# Patient Record
Sex: Male | Born: 1990 | Hispanic: No | Marital: Single | Smoking: Current every day smoker
Health system: Southern US, Community
[De-identification: ages and names within clinical notes are randomized; demographics above are authoritative.]

---

## 2014-04-21 ENCOUNTER — Emergency Department (HOSPITAL_COMMUNITY)
Admission: EM | Admit: 2014-04-21 | Discharge: 2014-04-21 | Disposition: A | Discharge status: Home / Self Care | Payer: Self-pay | Attending: Emergency Medicine | Admitting: Emergency Medicine

## 2014-04-21 DIAGNOSIS — I951 Orthostatic hypotension: Secondary | ICD-10-CM | POA: Insufficient documentation

## 2014-04-21 LAB — I-STAT CHEM 8, ED
BUN: 13 mg/dL (ref 6–23)
Calcium, Ion: 1.12 mmol/L (ref 1.12–1.23)
Chloride: 103 meq/L (ref 96–112)
Creatinine, Ser: 1.2 mg/dL (ref 0.50–1.35)
Glucose, Bld: 111 mg/dL — ABNORMAL HIGH (ref 70–99)
HCT: 43 % (ref 39.0–52.0)
Hemoglobin: 14.6 g/dL (ref 13.0–17.0)
Potassium: 4.1 meq/L (ref 3.7–5.3)
Sodium: 138 meq/L (ref 137–147)
TCO2: 24 mmol/L (ref 0–100)

## 2014-04-21 MED ORDER — SODIUM CHLORIDE 0.9 % IV SOLN
1000.0000 mL | INTRAVENOUS | Status: DC
  Administered 2014-04-21: 1000 mL via INTRAVENOUS

## 2014-04-21 MED ORDER — SODIUM CHLORIDE 0.9 % IV SOLN
1000.0000 mL | Freq: Once | INTRAVENOUS | Status: AC
  Administered 2014-04-21: 1000 mL via INTRAVENOUS
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
## 2014-04-21 NOTE — Discharge Instructions (Signed)
Syncope °Syncope means a person passes out (faints). The person usually wakes up in less than 5 minutes. It is important to seek medical care for syncope. °HOME CARE °· Have someone stay with you until you feel normal. °· Do not drive, use machines, or play sports until your doctor says it is okay. °· Keep all doctor visits as told. °· Lie down when you feel like you might pass out. Take deep breaths. Wait until you feel normal before standing up. °· Drink enough fluids to keep your pee (urine) clear or pale yellow. °· If you take blood pressure or heart medicine, get up slowly. Take several minutes to sit and then stand. °GET HELP RIGHT AWAY IF:  °· You have a severe headache. °· You have pain in the chest, belly (abdomen), or back. °· You are bleeding from the mouth or butt (rectum). °· You have black or tarry poop (stool). °· You have an irregular or very fast heartbeat. °· You have pain with breathing. °· You keep passing out, or you have shaking (seizures) when you pass out. °· You pass out when sitting or lying down. °· You feel confused. °· You have trouble walking. °· You have severe weakness. °· You have vision problems. °If you fainted, call your local emergency services (911 in [place] Do not drive yourself to the hospital. °MAKE SURE YOU:  °· Understand these instructions. °· Will watch your condition. °· Will get help right away if you are not doing well or get worse. °Document Released: 12/23/2007 Document Revised: 01/05/2012 Document Reviewed: 09/04/2011 °Exit[employer]® Patient Information ©2015 [employer]. This information is not intended to replace advice given to you by your health care provider. Make sure you discuss any questions you have with your health care provider. ° °

## 2014-04-21 NOTE — ED Notes (Signed)
[facility] presents with a 23 yo male from home with a syncopal episode.  Pt gave plasma today and plasma center was not able to give full blood return.  Pt did not eat or drink very well yesterday or today.  Pt was cooking when he didn't feel well, went to bathroom and passed out.  [facility] arrival pt was diaphoretic but alert and oriented; pt passed out again with [facility].  700 ml of NS given.  NSR on monitor.  CBG 96 mg/dl.  BP 115/65.  Alert and oriented at this time.

## 2017-03-12 ENCOUNTER — Emergency Department (HOSPITAL_COMMUNITY)
Admission: EM | Admit: 2017-03-12 | Discharge: 2017-03-13 | Disposition: A | Discharge status: Home / Self Care | Payer: Self-pay | Attending: Emergency Medicine | Admitting: Emergency Medicine

## 2017-03-12 ENCOUNTER — Encounter (HOSPITAL_COMMUNITY): Payer: Self-pay | Admitting: Emergency Medicine

## 2017-03-12 ENCOUNTER — Emergency Department (HOSPITAL_COMMUNITY): Payer: Self-pay

## 2017-03-12 DIAGNOSIS — Y998 Other external cause status: Secondary | ICD-10-CM | POA: Insufficient documentation

## 2017-03-12 DIAGNOSIS — F17200 Nicotine dependence, unspecified, uncomplicated: Secondary | ICD-10-CM | POA: Insufficient documentation

## 2017-03-12 DIAGNOSIS — R058 Other specified cough: Secondary | ICD-10-CM

## 2017-03-12 DIAGNOSIS — S0083XA Contusion of other part of head, initial encounter: Secondary | ICD-10-CM | POA: Insufficient documentation

## 2017-03-12 DIAGNOSIS — Y929 Unspecified place or not applicable: Secondary | ICD-10-CM | POA: Insufficient documentation

## 2017-03-12 DIAGNOSIS — R112 Nausea with vomiting, unspecified: Secondary | ICD-10-CM | POA: Insufficient documentation

## 2017-03-12 DIAGNOSIS — Y9389 Activity, other specified: Secondary | ICD-10-CM | POA: Insufficient documentation

## 2017-03-12 DIAGNOSIS — R05 Cough: Secondary | ICD-10-CM | POA: Insufficient documentation

## 2017-03-12 MED ORDER — ONDANSETRON HCL 4 MG/2ML IJ SOLN
4.0000 mg | Freq: Once | INTRAMUSCULAR | Status: AC
  Administered 2017-03-12: 4 mg via INTRAVENOUS
  Filled 2017-03-12: qty 2

## 2017-03-12 MED ORDER — MORPHINE SULFATE (PF) 4 MG/ML IV SOLN
4.0000 mg | Freq: Once | INTRAVENOUS | Status: AC
  Administered 2017-03-12: 4 mg via INTRAVENOUS
  Filled 2017-03-12: qty 1

## 2017-03-12 MED ORDER — ACETAMINOPHEN 325 MG PO TABS
325.0000 mg | ORAL_TABLET | Freq: Once | ORAL | Status: AC
  Administered 2017-03-12: 325 mg via ORAL
  Filled 2017-03-12: qty 1

## 2017-03-12 MED ORDER — IPRATROPIUM-ALBUTEROL 0.5-2.5 (3) MG/3ML IN SOLN
3.0000 mL | Freq: Once | RESPIRATORY_TRACT | Status: AC
  Administered 2017-03-13: 3 mL via RESPIRATORY_TRACT
  Filled 2017-03-12: qty 3
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
## 2017-03-13 MED ORDER — ALBUTEROL SULFATE HFA 108 (90 BASE) MCG/ACT IN AERS: 1.0000 | INHALATION_SPRAY | Freq: Four times a day (QID) | RESPIRATORY_TRACT | 0 refills | Status: AC | PRN
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
## 2017-03-30 ENCOUNTER — Telehealth: Payer: Self-pay
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
## 2017-04-01 ENCOUNTER — Ambulatory Visit: Payer: Self-pay | Admitting: Family Medicine

## 2018-11-30 IMAGING — DX DG CHEST 2V
2 series · 2 of 2 positions shown · non-contrast
Comparison: None.

CLINICAL DATA: Diffuse rhonchi

EXAM:
CHEST  2 VIEW

[chest pa]
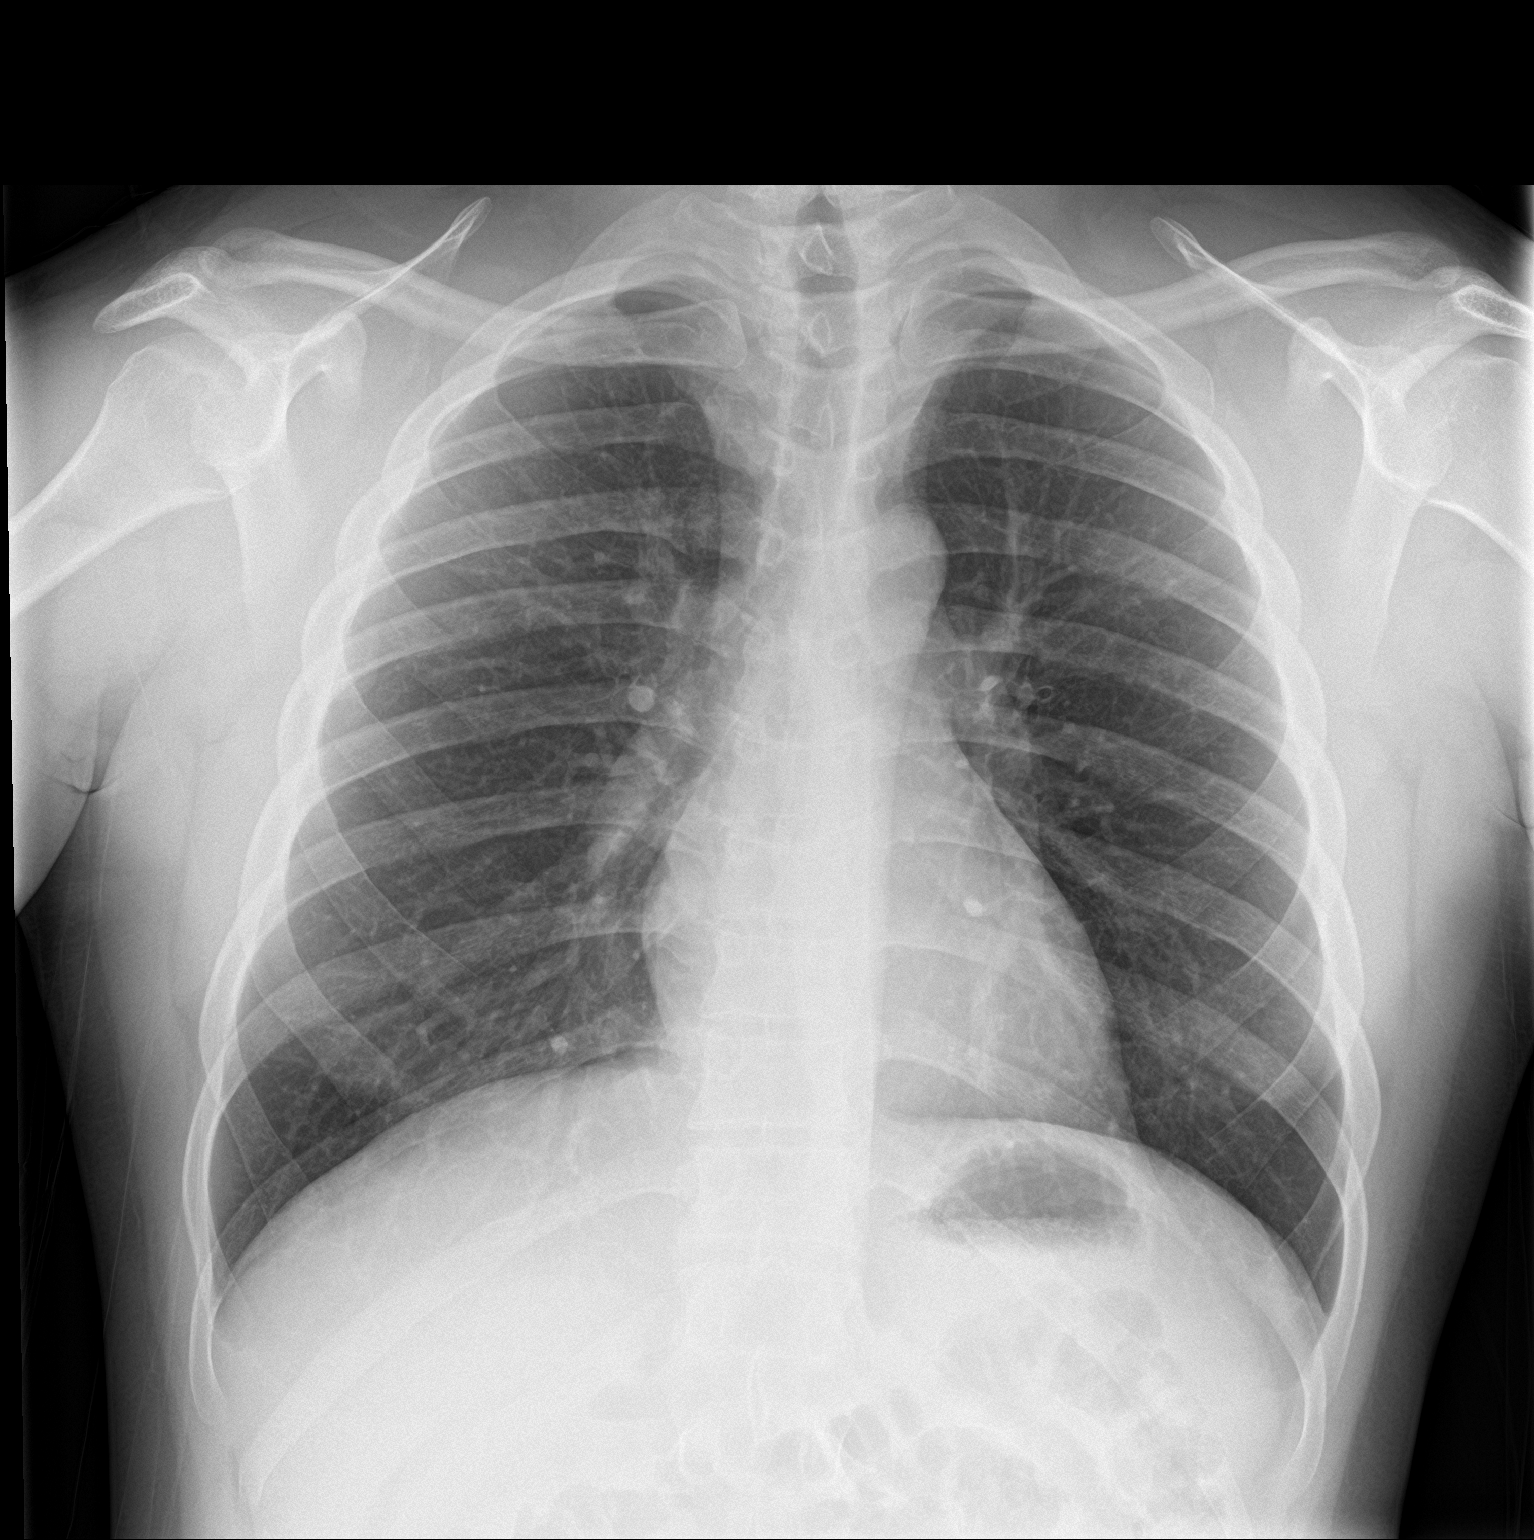

[chest lat]
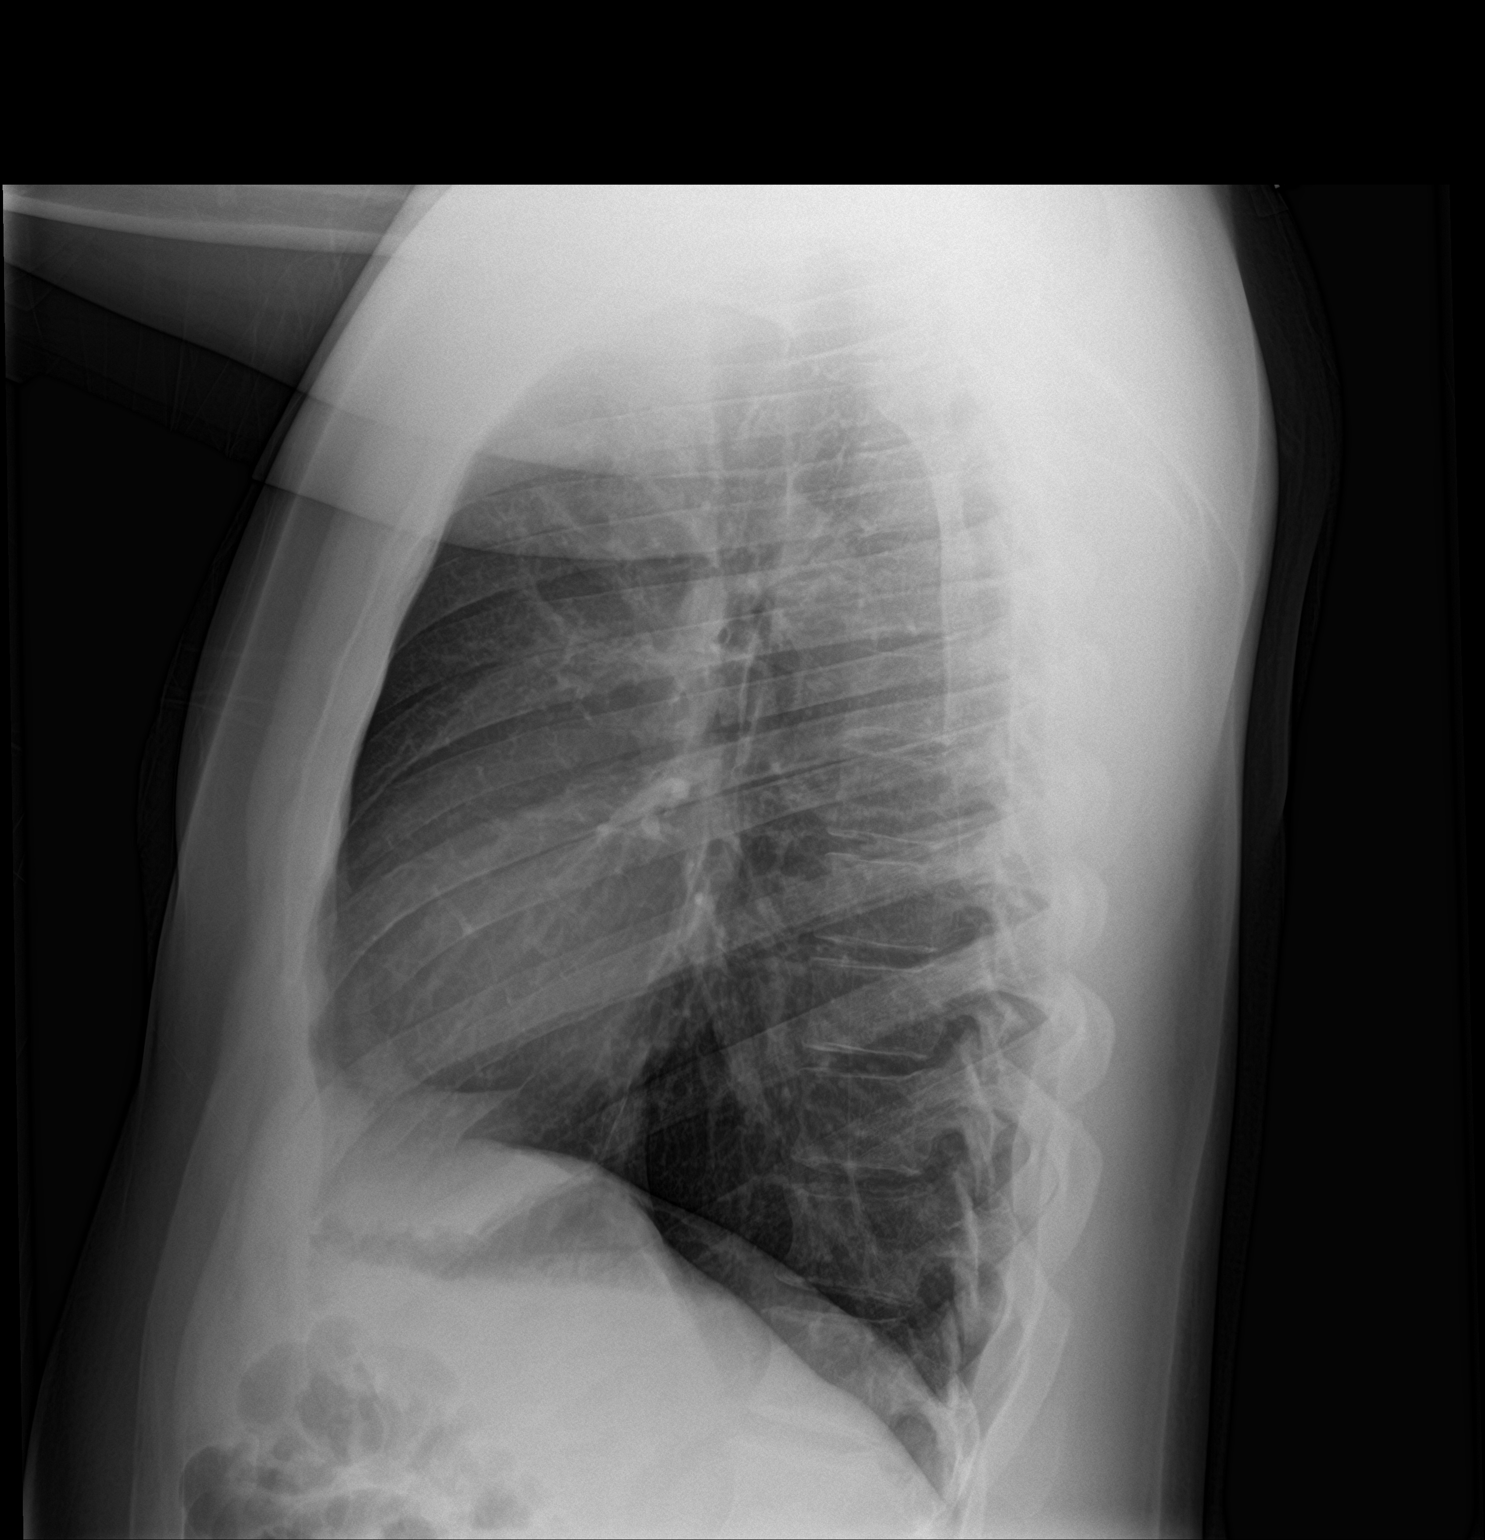

[2 of 2 positions shown; findings below may reference images not displayed]

FINDINGS: Lungs are clear.  No pleural effusion or pneumothorax.

The heart is normal in size.

Visualized osseous structures are within normal limits.
IMPRESSION: No active cardiopulmonary disease.

## 2019-05-22 ENCOUNTER — Other Ambulatory Visit: Payer: Self-pay

## 2019-05-22 DIAGNOSIS — Z20822 Contact with and (suspected) exposure to covid-19: Secondary | ICD-10-CM

## 2019-05-24 LAB — NOVEL CORONAVIRUS, NAA: SARS-CoV-2, NAA: NOT DETECTED

## 2019-10-27 ENCOUNTER — Ambulatory Visit: Payer: Self-pay | Attending: Internal Medicine

## 2019-11-02 ENCOUNTER — Ambulatory Visit: Payer: Self-pay | Attending: Internal Medicine

## 2019-11-02 DIAGNOSIS — Z23 Encounter for immunization: Secondary | ICD-10-CM

## 2019-11-02 NOTE — Progress Notes (Signed)
   Covid-19 Vaccination Clinic  Name:  Arthur Hansen    MRN: [id number] DOB: 11-02-1990  11/02/2019  Mr. Arthur Hansen was observed post Covid-19 immunization for 15 minutes without incident. He was provided with Vaccine Information Sheet and instruction to access the V-Safe system.   Mr. Arthur Hansen was instructed to call 911 with any severe reactions post vaccine: Marland Kitchen Difficulty breathing  . Swelling of face and throat  . A fast heartbeat  . A bad rash all over body  . Dizziness and weakness   Immunizations Administered    Name Date Dose VIS Date Route   Pfizer COVID-19 Vaccine 11/02/2019 10:34 AM 0.3 mL 06/30/2019 Intramuscular   Manufacturer: [employer], [employer]   Lot: [id number]   NDC: [address]-0

## 2019-11-29 ENCOUNTER — Ambulatory Visit: Payer: Self-pay | Attending: Internal Medicine

## 2019-11-29 DIAGNOSIS — Z23 Encounter for immunization: Secondary | ICD-10-CM

## 2019-11-29 NOTE — Progress Notes (Signed)
   Covid-19 Vaccination Clinic  Name:  Arthur Hansen    MRN: [id number] DOB: 06/03/1991  11/29/2019  Mr. [name] was observed post Covid-19 immunization for 15 minutes without incident. He was provided with Vaccine Information Sheet and instruction to access the V-Safe system.   Mr. [name] was instructed to call 911 with any severe reactions post vaccine: Marland Kitchen Difficulty breathing  . Swelling of face and throat  . A fast heartbeat  . A bad rash all over body  . Dizziness and weakness   Immunizations Administered    Name Date Dose VIS Date Route   Pfizer COVID-19 Vaccine 11/29/2019  1:34 PM 0.3 mL 09/13/2018 Intramuscular   Manufacturer: [employer], [employer]   Lot: [id number]   NDC: [address]-4

## 2023-07-28 ENCOUNTER — Emergency Department (HOSPITAL_COMMUNITY): Payer: 59

## 2023-07-28 ENCOUNTER — Encounter (HOSPITAL_COMMUNITY): Payer: Self-pay | Admitting: Emergency Medicine

## 2023-07-28 ENCOUNTER — Emergency Department (HOSPITAL_COMMUNITY)
Admission: EM | Admit: 2023-07-28 | Discharge: 2023-07-29 | Disposition: A | Discharge status: Home / Self Care | Payer: 59 | Attending: Emergency Medicine | Admitting: Emergency Medicine

## 2023-07-28 DIAGNOSIS — G40909 Epilepsy, unspecified, not intractable, without status epilepticus: Secondary | ICD-10-CM | POA: Insufficient documentation

## 2023-07-28 DIAGNOSIS — R569 Unspecified convulsions: Secondary | ICD-10-CM | POA: Diagnosis present

## 2023-07-28 DIAGNOSIS — G40109 Localization-related (focal) (partial) symptomatic epilepsy and epileptic syndromes with simple partial seizures, not intractable, without status epilepticus: Secondary | ICD-10-CM | POA: Diagnosis not present

## 2023-07-28 LAB — CBC
HCT: 49.7 % (ref 39.0–52.0)
Hemoglobin: 15.5 g/dL (ref 13.0–17.0)
MCH: 27.4 pg (ref 26.0–34.0)
MCHC: 31.2 g/dL (ref 30.0–36.0)
MCV: 87.8 fL (ref 80.0–100.0)
Platelets: 378 10*3/uL (ref 150–400)
RBC: 5.66 MIL/uL (ref 4.22–5.81)
RDW: 13.6 % (ref 11.5–15.5)
WBC: 17.6 10*3/uL — ABNORMAL HIGH (ref 4.0–10.5)
nRBC: 0.4 % — ABNORMAL HIGH (ref 0.0–0.2)

## 2023-07-28 LAB — CBG MONITORING, ED: Glucose-Capillary: 92 mg/dL (ref 70–99)

## 2023-07-28 MED ORDER — LORAZEPAM 2 MG/ML IJ SOLN
1.0000 mg | Freq: Once | INTRAMUSCULAR | Status: AC
  Administered 2023-07-28: 1 mg via INTRAVENOUS
  Filled 2023-07-28: qty 1

## 2023-07-28 NOTE — ED Provider Notes (Addendum)
 [facility] Provider Note   CSN: [id number] Arrival date & time: 07/28/23  2240     History  Chief Complaint  Patient presents with   Seizures    Arthur Hansen is a 33 y.o. male.  Patient presented to the emergency department after he had a seizure at home.  No history of seizures.  While in triage she became unresponsive, then started shaking all over.  He bit his tongue.  Symptoms lasted about 30 seconds, was postictal after, brought to the ED exam room immediately for evaluation.       Home Medications Prior to Admission medications   Medication Sig Start Date End Date Taking? Authorizing Provider  lacosamide  (VIMPAT ) 50 MG TABS tablet Take 1 tablet (50 mg total) by mouth 2 (two) times daily. 07/29/23  Yes Charrise Lardner, Lonni JINNY, MD  albuterol  (PROVENTIL  HFA;VENTOLIN  HFA) 108 (90 Base) MCG/ACT inhaler Inhale 1-2 puffs into the lungs every 6 (six) hours as needed for wheezing or shortness of breath. 03/13/17   Norris Will JINNY, PA-C      Allergies    Patient has no known allergies.    Review of Systems   Review of Systems  Physical Exam Updated Vital Signs BP 118/71   Pulse 93   Temp 98.5 F (36.9 C)   Resp 18   SpO2 99%  Physical Exam Vitals and nursing note reviewed.  Constitutional:      General: He is not in acute distress.    Appearance: He is well-developed.  HENT:     Head: Normocephalic and atraumatic.     Mouth/Throat:     Mouth: Mucous membranes are moist.  Eyes:     General: Vision grossly intact. Gaze aligned appropriately.     Extraocular Movements: Extraocular movements intact.     Conjunctiva/sclera: Conjunctivae normal.  Cardiovascular:     Rate and Rhythm: Normal rate and regular rhythm.     Pulses: Normal pulses.     Heart sounds: Normal heart sounds, S1 normal and S2 normal. No murmur heard.    No friction rub. No gallop.  Pulmonary:     Effort: Pulmonary effort is normal. No respiratory  distress.     Breath sounds: Normal breath sounds.  Abdominal:     Palpations: Abdomen is soft.     Tenderness: There is no abdominal tenderness. There is no guarding or rebound.     Hernia: No hernia is present.  Musculoskeletal:        General: No swelling.     Cervical back: Full passive range of motion without pain, normal range of motion and neck supple. No pain with movement, spinous process tenderness or muscular tenderness. Normal range of motion.     Right lower leg: No edema.     Left lower leg: No edema.  Skin:    General: Skin is warm and dry.     Capillary Refill: Capillary refill takes less than 2 seconds.     Findings: No ecchymosis, erythema, lesion or wound.  Neurological:     Mental Status: He is alert. He is confused.     Cranial Nerves: Cranial nerves 2-12 are intact.     Sensory: Sensation is intact.     Motor: Motor function is intact. No weakness or abnormal muscle tone.     Coordination: Coordination is intact.  Psychiatric:        Mood and Affect: Mood normal.  Speech: Speech normal.        Behavior: Behavior normal.     ED Results / Procedures / Treatments   Labs (all labs ordered are listed, but only abnormal results are displayed) Labs Reviewed  BASIC METABOLIC PANEL - Abnormal; Notable for the following components:      Result Value   CO2 11 (*)    Glucose, Bld 114 (*)    Creatinine, Ser 1.34 (*)    Calcium 10.4 (*)    Anion gap 26 (*)    All other components within normal limits  CBC - Abnormal; Notable for the following components:   WBC 17.6 (*)    nRBC 0.4 (*)    All other components within normal limits  RAPID URINE DRUG SCREEN, HOSP PERFORMED - Abnormal; Notable for the following components:   Tetrahydrocannabinol POSITIVE (*)    All other components within normal limits  MAGNESIUM - Abnormal; Notable for the following components:   Magnesium 3.0 (*)    All other components within normal limits  ETHANOL  CK  CBG MONITORING,  ED    EKG None  Radiology CT HEAD WO CONTRAST ( ) Result Date: 07/29/2023 CLINICAL DATA:  New onset seizures.  No trauma history. EXAM: CT HEAD WITHOUT CONTRAST TECHNIQUE: Contiguous axial images were obtained from the base of the skull through the vertex without intravenous contrast. RADIATION DOSE REDUCTION: This exam was performed according to the departmental dose-optimization program which includes automated exposure control, adjustment of the mA and/or kV according to patient size and/or use of iterative reconstruction technique. COMPARISON:  CT scan head 03/12/2017 FINDINGS: Brain: No evidence of acute infarction, hemorrhage, hydrocephalus, extra-axial collection or mass lesion/mass effect. There is a partially empty sella, unchanged. Vascular: No hyperdense vessel or unexpected calcification. Skull: Normal. Negative for fracture or focal lesion. Sinuses/Orbits: Unremarkable orbits, orbital contents. Chronic nasal bone fracture deformity. There is mild membrane thickening in the ethmoid air cells, both maxillary sinuses. No fluid levels. Other paranasal sinuses are clear. There is chronic fluid in left mastoid tip. The mastoid air cells are otherwise clear. Nasal septum is slightly S shaped. Other: None. IMPRESSION: 1. No acute intracranial CT findings or interval changes. 2. Partially empty sella, unchanged. 3. Mild sinus membrane disease. 4. Chronic fluid in left mastoid tip. 5. Chronic nasal bone fracture deformity. Electronically Signed   By: Francis Quam M.D.   On: 07/29/2023 00:44   DG Chest Port 1 View Result Date: 07/28/2023 CLINICAL DATA:  Recent seizure activity EXAM: PORTABLE CHEST 1 VIEW COMPARISON:  03/12/2017 FINDINGS: The heart size and mediastinal contours are within normal limits. Both lungs are clear. The visualized skeletal structures are unremarkable. IMPRESSION: No active disease. Electronically Signed   By: Oneil Devonshire M.D.   On: 07/28/2023 23:28     Procedures Procedures    Medications Ordered in ED Medications  lacosamide  (VIMPAT ) tablet 50 mg (has no administration in time range)  LORazepam  (ATIVAN ) injection 1 mg (1 mg Intravenous Given 07/28/23 2346)  sodium chloride  0.9 % bolus 1,000 mL (0 mLs Intravenous Stopped 07/29/23 0506)  ondansetron  (ZOFRAN ) injection 4 mg (4 mg Intravenous Given 07/29/23 0454)    ED Course/ Medical Decision Making/ A&P                                 Medical Decision Making Amount and/or Complexity of Data Reviewed Labs: ordered. Radiology: ordered.  Risk Prescription drug management.  Differential diagnosis considered includes, but not limited to: TIA; Stroke; ICH; Seizure; electrolyte abnormality; hypoglycemia; toxic/pharmacologic causes; CNS infection; psychiatric disorder   Brought to the emergency department after suspected seizure.  Patient does not have a seizure disorder.  He does admit to smoking marijuana tonight.  Patient was noted to have an unresponsive episode with shaking by friends that was suspected to be a seizure.  He was brought to the ED and in triage had a witnessed seizure.  Both are self-limited.  Patient postictal when I evaluated him.  Was given Ativan  to prevent further seizures.  CT head unremarkable.  Lab work unremarkable.  Patient has now returned to baseline.  Patient reports that he did recognize a strange smell before the seizures.  He reports that he has smelled the smell several times recently but has not had a seizure previously.  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  Discussed briefly with neurology on-call, will perform MRI brain with and without contrast, EEG.  Initiate Vimpat  per neurology.  Patient with anion gap at arrival, felt to be consistent with increased lactic acid secondary to 2 generalized tonic-clonic seizures.  Does not require recheck, given IV hydration.        Final Clinical Impression(s) / ED Diagnoses Final diagnoses:   Seizure disorder (HCC)    Rx / DC Orders ED Discharge Orders          Ordered    Ambulatory referral to Neurology  Status:  Canceled       Comments: An appointment is requested in approximately: 4 weeks   07/29/23 0508    levETIRAcetam  (KEPPRA ) 500 MG tablet  2 times daily,   Status:  Discontinued        07/29/23 0508    Ambulatory referral to Neurology       Comments: An appointment is requested in approximately: 4 weeks - 8 weeks   07/29/23 0524    lacosamide  (VIMPAT ) 50 MG TABS tablet  2 times daily        07/29/23 0658              Haze Lonni PARAS, MD 07/29/23 9490    Haze Lonni PARAS, MD 07/29/23 [phone]

## 2023-07-28 NOTE — ED Notes (Signed)
 Pt had an approximate 30 second seizure while in triage. Pt moved to treatment room.

## 2023-07-28 NOTE — ED Triage Notes (Signed)
 BIB EMS for seizure. Pt was in car with friend and had a 30 second seizure. Pt was able to get pt out of car and onto the ground. Pt does not have a hx of seizures. Pt was postictal on EMS arrival. Abrasions to left elbow. No oral trauma. Pt was confused on EMS arrival and came around about 10 mins later.    CBG 111

## 2023-07-28 NOTE — ED Triage Notes (Signed)
 Pt ambulatory to bathroom. Alert and oriented. Admits to smoking marijuana this evening states it is a source he get is from all the time.

## 2023-07-29 ENCOUNTER — Emergency Department (HOSPITAL_COMMUNITY): Payer: 59

## 2023-07-29 DIAGNOSIS — G40109 Localization-related (focal) (partial) symptomatic epilepsy and epileptic syndromes with simple partial seizures, not intractable, without status epilepticus: Secondary | ICD-10-CM

## 2023-07-29 DIAGNOSIS — G40909 Epilepsy, unspecified, not intractable, without status epilepticus: Secondary | ICD-10-CM

## 2023-07-29 LAB — BASIC METABOLIC PANEL
Anion gap: 26 — ABNORMAL HIGH (ref 5–15)
BUN: 10 mg/dL (ref 6–20)
CO2: 11 mmol/L — ABNORMAL LOW (ref 22–32)
Calcium: 10.4 mg/dL — ABNORMAL HIGH (ref 8.9–10.3)
Chloride: 107 mmol/L (ref 98–111)
Creatinine, Ser: 1.34 mg/dL — ABNORMAL HIGH (ref 0.61–1.24)
GFR, Estimated: >60 mL/min (ref >60)
Glucose, Bld: 114 mg/dL — ABNORMAL HIGH (ref 70–99)
Potassium: 3.8 mmol/L (ref 3.5–5.1)
Sodium: 144 mmol/L (ref 135–145)

## 2023-07-29 LAB — RAPID URINE DRUG SCREEN, HOSP PERFORMED
Amphetamines: NOT DETECTED
Barbiturates: NOT DETECTED
Benzodiazepines: NOT DETECTED
Cocaine: NOT DETECTED
Opiates: NOT DETECTED
Tetrahydrocannabinol: POSITIVE — AB

## 2023-07-29 LAB — CK: Total CK: 384 U/L (ref 49–397)

## 2023-07-29 LAB — ETHANOL: Alcohol, Ethyl (B): <10 mg/dL (ref <10)

## 2023-07-29 LAB — MAGNESIUM: Magnesium: 3 mg/dL — ABNORMAL HIGH (ref 1.7–2.4)

## 2023-07-29 MED ORDER — LEVETIRACETAM IN NACL 1500 MG/100ML IV SOLN
1500.0000 mg | Freq: Once | INTRAVENOUS | Status: DC
  Filled 2023-07-29: qty 100

## 2023-07-29 MED ORDER — SODIUM CHLORIDE 0.9 % IV BOLUS
1000.0000 mL | Freq: Once | INTRAVENOUS | Status: AC
  Administered 2023-07-29: 1000 mL via INTRAVENOUS

## 2023-07-29 MED ORDER — ACETAMINOPHEN 325 MG PO TABS
325.0000 mg | ORAL_TABLET | Freq: Once | ORAL | Status: AC
  Administered 2023-07-29: 325 mg via ORAL
  Filled 2023-07-29: qty 1

## 2023-07-29 MED ORDER — LACOSAMIDE 50 MG PO TABS: 50.0000 mg | ORAL_TABLET | Freq: Two times a day (BID) | ORAL | 3 refills | Status: DC

## 2023-07-29 MED ORDER — IBUPROFEN 800 MG PO TABS
800.0000 mg | ORAL_TABLET | Freq: Once | ORAL | Status: AC
  Administered 2023-07-29: 800 mg via ORAL
  Filled 2023-07-29: qty 1

## 2023-07-29 MED ORDER — LEVETIRACETAM 500 MG PO TABS: 500.0000 mg | ORAL_TABLET | Freq: Once | ORAL | Status: DC

## 2023-07-29 MED ORDER — ONDANSETRON HCL 4 MG/2ML IJ SOLN
4.0000 mg | Freq: Once | INTRAMUSCULAR | Status: AC
  Administered 2023-07-29: 4 mg via INTRAVENOUS
  Filled 2023-07-29: qty 2

## 2023-07-29 MED ORDER — LEVETIRACETAM 500 MG PO TABS: 500.0000 mg | ORAL_TABLET | Freq: Two times a day (BID) | ORAL | 2 refills | Status: DC

## 2023-07-29 MED ORDER — LORAZEPAM 2 MG/ML IJ SOLN
1.0000 mg | Freq: Once | INTRAMUSCULAR | Status: AC
  Administered 2023-07-29: 1 mg via INTRAVENOUS
  Filled 2023-07-29: qty 1

## 2023-07-29 MED ORDER — LACOSAMIDE 50 MG PO TABS
50.0000 mg | ORAL_TABLET | Freq: Two times a day (BID) | ORAL | Status: DC
  Administered 2023-07-29: 50 mg via ORAL
  Filled 2023-07-29: qty 1

## 2023-07-29 NOTE — ED Provider Notes (Addendum)
 7:15 AM Patient signed out to me by previous ED physician. Pt is a 33 yo male presenting for new seizure x 2 episodes. Post ictal on arrival.   Neuro has seen. Pt started on Vimpat . CT head stable. Beside EEG completed overnight and normal.  MRI pending  Plan for outpatient f/u with neuro outpatient   Physical Exam  BP 118/71   Pulse 93   Temp 98.5 F (36.9 C)   Resp 18   SpO2 99%   Physical Exam  Procedures  Procedures  ED Course / MDM    Medical Decision Making Amount and/or Complexity of Data Reviewed Labs: ordered. Radiology: ordered.  Risk Prescription drug management.   MRI was limited due to patient movement but overall was very stable.  Patient ready for outpatient follow-up with neurology and safe for discharge at this time.  Patient in no distress and overall condition improved here in the ED. Detailed discussions were had with the patient regarding current findings, and need for close f/u with PCP or on call doctor. The patient has been instructed to return immediately if the symptoms worsen in any way for re-evaluation. Patient verbalized understanding and is in agreement with current care plan. All questions answered prior to discharge.        Elnor Bernarda SQUIBB, DO 07/29/23 1034    Elnor Bernarda SQUIBB, DO 07/29/23 1102

## 2023-07-29 NOTE — ED Notes (Signed)
 Patient transported to MRI

## 2023-07-29 NOTE — Procedures (Signed)
 Patient Name: Arthur Hansen  MRN: [id number]  Epilepsy Attending: Arlin MALVA Krebs  Referring Physician/Provider: Jerrie Lola CROME, MD  Date: 07/29/2023 Duration: 24.15 mins  Patient history: 33 y.o. with no significant past medical history, presenting with first-time seizure.   Level of alertness: Awake, drowsy  AEDs during EEG study:   Technical aspects: This EEG study was done with scalp electrodes positioned according to the 10-20 International system of electrode placement. Electrical activity was reviewed with band pass filter of 1-70Hz , sensitivity of 7 uV/mm, display speed of 83mm/sec with a 60Hz  notched filter applied as appropriate. EEG data were recorded continuously and digitally stored.  Video monitoring was available and reviewed as appropriate.  Description: The posterior dominant rhythm consists of 10 Hz activity of moderate voltage (25-35 uV) seen predominantly in posterior head regions, symmetric and reactive to eye opening and eye closing. Drowsiness was characterized by attenuation of the posterior background rhythm. Hyperventilation and photic stimulation were not performed.     IMPRESSION: This study is within normal limits. No seizures or epileptiform discharges were seen throughout the recording.  A normal interictal EEG does not exclude the diagnosis of epilepsy.  [name]

## 2023-07-29 NOTE — Consult Note (Signed)
 NEUROLOGY CONSULT NOTE   Date of service: July 29, 2023 Patient Name: Arthur Hansen MRN:  [id number] DOB:  07/02/1991 Chief Complaint: Seizure Requesting Provider: Haze Lonni JINNY, *  History of Present Illness  Arthur Hansen is a 33 y.o. with no significant past medical history, presenting with first-time seizure.  He is accompanied by his breast friend and his uncle who helps supplement the history as he is still somewhat fatigued  They report he is in his usual state of health today when he got a look of panic and smelled something burning.  This was followed by generalized tonic-clonic seizure.  He did fully return to baseline after this event but had a second generalized tonic-clonic seizure witnessed while in the waiting room which was again preceded by the same aura.  He does report that he suffers from depression to some degree. Some reduced sleep recently, otherwise no concerns   Spell #  - Semiology: Generalized tonic clonic seizure - Prodome: Burning smell, panicked look - Post-spell: Confusion - Triggers: Sleep deprivation,?  THC exposure - Frequency: Aura alone 1x every 2 months since approx Jun 2024, 1/8 first time this progressed to GTC (x 2 episodes)   Risk factors:  Birth and development: May have been premature Febrile seizures in childhood: No Significant head trauma: 1 concussion without loss of consciousness Intracranial surgeries: No Mengingitis/Encephalitis history: No Family history of seizures or developmental delay: No Family history of autoimmune disease: No   ROS  Comprehensive ROS performed and pertinent positives documented in HPI    Past History  History reviewed. No pertinent past medical history.  History reviewed. No pertinent surgical history.  Family History: No history of seizures or autoimmune disease or developmental delay  Social History  reports that he has been smoking. He has never used smokeless tobacco. He  reports current drug use. Drug: Marijuana. He reports that he does not drink alcohol.  No Known Allergies  Medications   Current Facility-Administered Medications:    levETIRAcetam  (KEPPRA ) tablet 500 mg, 500 mg, Oral, Once, Pollina, Lonni JINNY, MD  Current Outpatient Medications:    levETIRAcetam  (KEPPRA ) 500 MG tablet, Take 1 tablet (500 mg total) by mouth 2 (two) times daily., Disp: 60 tablet, Rfl: 2   albuterol  (PROVENTIL  HFA;VENTOLIN  HFA) 108 (90 Base) MCG/ACT inhaler, Inhale 1-2 puffs into the lungs every 6 (six) hours as needed for wheezing or shortness of breath., Disp: 1 Inhaler, Rfl: 0  Vitals   Vitals:   07/28/23 2249 07/29/23 0308 07/29/23 0310  BP: 120/75  131/84  Pulse: 80  88  Resp: 18  (!) 22  Temp: 98.5 F (36.9 C) 98.6 F (37 C)   TempSrc: Oral Oral   SpO2: 98%  96%    There is no height or weight on file to calculate BMI.  Physical Exam   Constitutional: Appears well-developed and well-nourished.  Moving gingerly secondary to back pain Psych: Affect calm, cooperative, fatigued Eyes: No scleral injection.  HENT: No OP obstruction.  Head: Normocephalic.  Cardiovascular: Normal rate and regular rhythm.  Respiratory: Effort normal, non-labored breathing.  GI: Soft.  No distension. There is no tenderness.  Skin: WDI.  MSK: No point tenderness to spine palpation. No step offs  Neurologic Examination   Neuro: Mental Status: Patient is sleepy but able to stay awake for exam, oriented to person, place, month, year, and situation. Patient is able to give a clear and coherent history. No signs of aphasia or neglect Cranial Nerves: II:  Visual Fields are full. Pupils are equal, round, and reactive to light.   III,IV, VI: EOMI without ptosis or diploplia.  V: Facial sensation is symmetric to temperature VII: Facial movement is symmetric.  VIII: hearing is intact to voice X: Uvula elevates symmetrically XI: Shoulder shrug is symmetric. XII: tongue is  midline without atrophy or fasciculations.  Motor: Tone is normal. Bulk is normal. 4+/5 strength was present in all four extremities, symmetric (secondary to fatigue, muscle aches, recent seizure)  Sensory: Sensation is symmetric to light touch and temperature in the arms and legs. Deep Tendon Reflexes: 2+ and symmetric in the biceps and patellae.  Cerebellar: FNF and HKS are intact bilaterally Gait:  Antalgic secondary to back pain but otherwise normal casual gait  Labs/Imaging/Neurodiagnostic studies   CBC:  Recent Labs  Lab Aug 18, 2023 2315  WBC 17.6*  HGB 15.5  HCT 49.7  MCV 87.8  PLT 378   Basic Metabolic Panel:  Lab Results  Component Value Date   NA 144 August 18, 2023   K 3.8 Aug 18, 2023   CO2 11 (L) 08/18/2023   GLUCOSE 114 (H) August 18, 2023   BUN 10 2023-08-18   CREATININE 1.34 (H) August 18, 2023   CALCIUM 10.4 (H) 08/18/23   GFRNONAA >60 08-18-23   Lipid Panel: No results found for: LDLCALC HgbA1c: No results found for: HGBA1C Urine Drug Screen:     Component Value Date/Time   LABOPIA NONE DETECTED 08-18-23 2318   COCAINSCRNUR NONE DETECTED 08/18/2023 2318   LABBENZ NONE DETECTED 2023/08/18 2318   AMPHETMU NONE DETECTED Aug 18, 2023 2318   THCU POSITIVE (A) 08/18/23 2318   LABBARB NONE DETECTED 08-18-2023 2318    Alcohol Level     Component Value Date/Time   ETH <10 Aug 18, 2023 2315   INR No results found for: INR APTT No results found for: APTT AED levels: No results found for: PHENYTOIN, ZONISAMIDE, LAMOTRIGINE, LEVETIRACETA  CT Head without contrast(Personally reviewed): 1. No acute intracranial CT findings or interval changes. 2. Partially empty sella, unchanged. 3. Mild sinus membrane disease. 4. Chronic fluid in left mastoid tip. 5. Chronic nasal bone fracture deformity.  MRI Brain with and without contrast (Personally reviewed): pending  Neurodiagnostics rEEG:  Report pending  ASSESSMENT   Arthur Hansen is a 33 y.o.  male who has a history significant for depression presenting with new onset seizures with aura.  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  MRI brain with and without contrast is prudent to more sensitively rule out structural lesions than Head CT.   RECOMMENDATIONS  - Vimpat  50 mg BID   - Outpatient consider cross titration to lamotrigine for dual benefit of mood stabilization and seizure control - Routine EEG, completed, read pending - MRI brain w/ and w/o contrast - If MRI brain is reassuring, outpatient neurology follow-up is appropriate - Outpatient referral to Dr. Gregg  Standard seizure precautions: Per [place]  DMV statutes, patients with seizures are not allowed to drive until  they have been seizure-free for six months. Use caution when using heavy equipment or power tools. Avoid working on ladders or at heights. Take showers instead of baths. Ensure the water temperature is not too high on the home water heater. Do not go swimming alone. When caring for infants or small children, sit down when holding, feeding, or changing them to minimize risk of injury to the child in the event you have a seizure.  To reduce risk of seizures, maintain good sleep hygiene avoid  alcohol and illicit drug use, take all anti-seizure medications as prescribed.   ______________________________________________________________________   Lola Jernigan MD-PhD [facility] [phone] Available 7 PM to 7 AM, outside of these hours please call Neurologist on call as listed on Amion.

## 2023-07-29 NOTE — Discharge Instructions (Addendum)
Seizure precautions: Per [place] DMV statutes, patients with seizures are not allowed to drive until they have been seizure-free for six months and cleared by a physician    Use caution when using heavy equipment or power tools. Avoid working on ladders or at heights. Take showers instead of baths. Ensure the water temperature is not too high on the home water heater. Do not go swimming alone. Do not lock yourself in a room alone (i.e. bathroom). When caring for infants or small children, sit down when holding, feeding, or changing them to minimize risk of injury to the child in the event you have a seizure. Maintain good sleep hygiene. Avoid alcohol.  

## 2023-07-29 NOTE — Progress Notes (Signed)
 EEG complete - results pending

## 2023-08-04 ENCOUNTER — Encounter: Payer: Self-pay | Admitting: Neurology

## 2023-08-04 ENCOUNTER — Ambulatory Visit (INDEPENDENT_AMBULATORY_CARE_PROVIDER_SITE_OTHER): Payer: 59 | Admitting: Neurology

## 2023-08-04 VITALS — BP 131/88 | HR 76 | Ht 69.0 in | Wt 217.9 lb

## 2023-08-04 DIAGNOSIS — R569 Unspecified convulsions: Secondary | ICD-10-CM

## 2023-08-04 MED ORDER — LACOSAMIDE 100 MG PO TABS: 100.0000 mg | ORAL_TABLET | Freq: Two times a day (BID) | ORAL | 5 refills | Status: AC

## 2023-08-04 NOTE — Progress Notes (Signed)
 [facility]  PATIENT: Arthur Hansen DOB: 1990-09-12  REQUESTING CLINICIAN: Ronnette Coke, MD HISTORY FROM: Patient  REASON FOR VISIT: Seizure    HISTORICAL  CHIEF COMPLAINT:  Chief Complaint  Patient presents with   New Patient (Initial Visit)    Rm13, granddad present, /internal hospital referral for new onset seizures:none to report since ed visit    HISTORY OF PRESENT ILLNESS:  This is a 32 year old gentleman with no reported past medical history who is presenting after his first lifetime seizure on January 8.  Patient reports having a first generalized seizure in the setting of smoking weed.  He reports experiencing abnormal smell for the past 8 months and had the same smell prior to his seizure. He was taken to the hospital where he was witnessed to have another generalized convulsion.  He is MRI did not show any acute abnormality and he was started on 2 antiseizure medications, levetiracetam  and lacosamide . EEG was normal.  Since discharge from the hospital he has been compliant with the medication but reports side effect of somnolence, fatigue, depression.  He denies any previous history of seizure but reports in the past 8 months he has been experiencing abnormal smell that he described as cold and mothballs smell.  He had this typical smell prior to this seizure.  He denies any other seizure risk factors, no family history of seizures.  Handedness: Right handed   Onset: July 28 2023  Seizure Type: Generalized convulsion. Abnormal smell prior to convulsion. He reports history of abnormal smells for the past 8 months   Current frequency: Only once but reports history of abnormal smell  Any injuries from seizures: None   Seizure risk factors: Concussion  Previous ASMs: None   Currenty ASMs: Levetiracetam  500 mg twice daily and Vimpat  50 mg twice daily   ASMs side effects: Tiredness, fatigue, dizziness, increase depression   Brain Images:  No acute abnormalities   Previous EEGs: Normal   OTHER MEDICAL CONDITIONS: None reported   REVIEW OF SYSTEMS: Full 14 system review of systems performed and negative with exception of: As noted in the HPI   ALLERGIES: No Known Allergies  HOME MEDICATIONS: Outpatient Medications Prior to Visit  Medication Sig Dispense Refill   albuterol  (PROVENTIL  HFA;VENTOLIN  HFA) 108 (90 Base) MCG/ACT inhaler Inhale 1-2 puffs into the lungs every 6 (six) hours as needed for wheezing or shortness of breath. 1 Inhaler 0   lacosamide  (VIMPAT ) 50 MG TABS tablet Take 1 tablet (50 mg total) by mouth 2 (two) times daily. 60 tablet 3   levETIRAcetam  (KEPPRA ) 500 MG tablet Take 500 mg by mouth 2 (two) times daily.     No facility-administered medications prior to visit.    PAST MEDICAL HISTORY: History reviewed. No pertinent past medical history.  PAST SURGICAL HISTORY: History reviewed. No pertinent surgical history.  FAMILY HISTORY: History reviewed. No pertinent family history.  SOCIAL HISTORY: Social History   Socioeconomic History   Marital status: Single    Spouse name: Not on file   Number of children: Not on file   Years of education: Not on file   Highest education level: Not on file  Occupational History   Not on file  Tobacco Use   Smoking status: Every Day   Smokeless tobacco: Never  Substance and Sexual Activity   Alcohol use: No   Drug use: Yes    Types: Marijuana    Comment: today   Sexual activity: Yes  Other Topics Concern  Not on file  Social History Narrative   Not on file   Social Drivers of Health   Financial Resource Strain: Not on file  Food Insecurity: Not on file  Transportation Needs: Not on file  Physical Activity: Not on file  Stress: Not on file  Social Connections: Not on file  Intimate Partner Violence: Not on file     PHYSICAL EXAM  GENERAL EXAM/CONSTITUTIONAL: Vitals:  Vitals:   08/04/23 1556 08/04/23 1630  BP: (!) 144/105 131/88   Pulse: 76   SpO2: 94%   Weight: 217 lb 14.4 oz (98.8 kg)   Height: 5\' 9"  (1.753 m)    Body mass index is 32.18 kg/m. Wt Readings from Last 3 Encounters:  08/04/23 217 lb 14.4 oz (98.8 kg)  03/12/17 170 lb (77.1 kg)   Patient is in no distress; well developed, nourished and groomed; neck is supple  MUSCULOSKELETAL: Gait, strength, tone, movements noted in Neurologic exam below  NEUROLOGIC: MENTAL STATUS:      No data to display         awake, alert, oriented to person, place and time recent and remote memory intact normal attention and concentration language fluent, comprehension intact, naming intact fund of knowledge appropriate  CRANIAL NERVE:  2nd, 3rd, 4th, 6th - Visual fields full to confrontation, extraocular muscles intact, no nystagmus 5th - facial sensation symmetric 7th - facial strength symmetric 8th - hearing intact 9th - palate elevates symmetrically, uvula midline 11th - shoulder shrug symmetric 12th - tongue protrusion midline  MOTOR:  normal bulk and tone, full strength in the BUE, BLE  SENSORY:  normal and symmetric to light touch  COORDINATION:  finger-nose-finger, fine finger movements normal  GAIT/STATION:  normal   DIAGNOSTIC DATA (LABS, IMAGING, TESTING) - I reviewed patient records, labs, notes, testing and imaging myself where available.  Lab Results  Component Value Date   WBC 17.6 (H) 07/28/2023   HGB 15.5 07/28/2023   HCT 49.7 07/28/2023   MCV 87.8 07/28/2023   PLT 378 07/28/2023      Component Value Date/Time   NA 144 07/28/2023 2315   K 3.8 07/28/2023 2315   CL 107 07/28/2023 2315   CO2 11 (L) 07/28/2023 2315   GLUCOSE 114 (H) 07/28/2023 2315   BUN 10 07/28/2023 2315   CREATININE 1.34 (H) 07/28/2023 2315   CALCIUM 10.4 (H) 07/28/2023 2315   GFRNONAA >60 07/28/2023 2315   No results found for: "CHOL", "HDL", "LDLCALC", "LDLDIRECT", "TRIG" No results found for: "HGBA1C" No results found for: "VITAMINB12" No  results found for: "TSH"  MRI Brain 07/29/2023 Incomplete study without evidence of acute intracranial abnormality.   Routine EEG 07/29/2023: Normal, no epileptiform discharges seen   I personally reviewed brain Images and previous EEG reports.   ASSESSMENT AND PLAN  33 y.o. year old male  with history of depression who is presenting after his first lifetime seizure. This was in the setting of smoking marijuana.  He also reports a sensation of abnormal smell that he has been experiencing for the past 48-months and had the same smell prior to his seizure.  He is on Keppra  and Vimpat  and complains of somnolence, tiredness, and worsening depression.  We will discontinue Keppra  and increase his Vimpat  to 100 mg twice daily.  Will repeat EEG, based on result we may repeat MRI brain.  I will contact him after completion of the EEG otherwise I will see him in 6 months for follow-up or sooner if worse.  Advised patient to contact me if he does have a breakthrough seizure.  We also discussed driving restriction for the next 6 months and he voiced understanding.   1. Seizures (HCC)     Patient Instructions  Discontinue Keppra   Increase Vimpat  to 100 mg twice daily  Routine EEG  Return in 6 months or sooner if worse  Please contact me if you have another seizure   Per [place]  DMV statutes, patients with seizures are not allowed to drive until they have been seizure-free for six months.  Other recommendations include using caution when using heavy equipment or power tools. Avoid working on ladders or at heights. Take showers instead of baths.  Do not swim alone.  Ensure the water temperature is not too high on the home water heater. Do not go swimming alone. Do not lock yourself in a room alone (i.e. bathroom). When caring for infants or small children, sit down when holding, feeding, or changing them to minimize risk of injury to the child in the event you have a seizure. Maintain good sleep hygiene.  Avoid alcohol.  Also recommend adequate sleep, hydration, good diet and minimize stress.   During the Seizure  - First, ensure adequate ventilation and place patients on the floor on their left side  Loosen clothing around the neck and ensure the airway is patent. If the patient is clenching the teeth, do not force the mouth open with any object as this can cause severe damage - Remove all items from the surrounding that can be hazardous. The patient may be oblivious to what's happening and may not even know what he or she is doing. If the patient is confused and wandering, either gently guide him/her away and block access to outside areas - Reassure the individual and be comforting - Call 911. In most cases, the seizure ends before EMS arrives. However, there are cases when seizures may last over 3 to 5 minutes. Or the individual may have developed breathing difficulties or severe injuries. If a pregnant patient or a person with diabetes develops a seizure, it is prudent to call an ambulance. - Finally, if the patient does not regain full consciousness, then call EMS. Most patients will remain confused for about 45 to 90 minutes after a seizure, so you must use judgment in calling for help. - Avoid restraints but make sure the patient is in a bed with padded side rails - Place the individual in a lateral position with the neck slightly flexed; this will help the saliva drain from the mouth and prevent the tongue from falling backward - Remove all nearby furniture and other hazards from the area - Provide verbal assurance as the individual is regaining consciousness - Provide the patient with privacy if possible - Call for help and start treatment as ordered by the caregiver   After the Seizure (Postictal Stage)  After a seizure, most patients experience confusion, fatigue, muscle pain and/or a headache. Thus, one should permit the individual to sleep. For the next few days, reassurance is  essential. Being calm and helping reorient the person is also of importance.  Most seizures are painless and end spontaneously. Seizures are not harmful to others but can lead to complications such as stress on the lungs, brain and the heart. Individuals with prior lung problems may develop labored breathing and respiratory distress.    Discussed Patients with epilepsy have a small risk of sudden unexpected death, a condition referred to as sudden unexpected death  in epilepsy (SUDEP). SUDEP is defined specifically as the sudden, unexpected, witnessed or unwitnessed, nontraumatic and nondrowning death in patients with epilepsy with or without evidence for a seizure, and excluding documented status epilepticus, in which post mortem examination does not reveal a structural or toxicologic cause for death     Orders Placed This Encounter  Procedures   EEG adult    Meds ordered this encounter  Medications   lacosamide  100 MG TABS    Sig: Take 1 tablet (100 mg total) by mouth 2 (two) times daily.    Dispense:  60 tablet    Refill:  5    Return in about 6 months (around 02/01/2024).    Cassandra Cleveland, MD 08/04/2023, 4:53 PM  [facility] [address] [place], [place] [address] [phone]

## 2023-08-04 NOTE — Patient Instructions (Signed)
 Discontinue Keppra   Increase Vimpat  to 100 mg twice daily  Routine EEG  Return in 6 months or sooner if worse  Please contact me if you have another seizure

## 2023-08-18 ENCOUNTER — Telehealth: Payer: Self-pay | Admitting: Neurology

## 2023-08-18 NOTE — Telephone Encounter (Signed)
Unable to reach pt over the phone, no VM box. Sent mychart msg informing pt of need to reschedule EEG appointment - Tresa Endo out

## 2023-08-26 ENCOUNTER — Other Ambulatory Visit: Payer: 59 | Admitting: *Deleted

## 2023-08-30 ENCOUNTER — Telehealth: Payer: Self-pay | Admitting: *Deleted

## 2023-08-30 ENCOUNTER — Other Ambulatory Visit: Payer: 59 | Admitting: *Deleted

## 2023-08-30 NOTE — Telephone Encounter (Signed)
 Pt has called, his EEG has been r/s with the wait list.

## 2023-08-30 NOTE — Telephone Encounter (Signed)
 Provider out

## 2023-09-07 ENCOUNTER — Telehealth: Payer: Self-pay | Admitting: Neurology

## 2023-09-07 NOTE — Telephone Encounter (Signed)
 Pt was in office, EEG has been canclled 3X's.Tresa Endo sick) Pt has been having massive headaches, heart palpitations, insomnia, tightness in chest, depression has been more so. He feels maybe meds to blame. Please call pt at [phone]. Advised to go to ER at anytime he feels he needs urgent care

## 2023-09-07 NOTE — Telephone Encounter (Signed)
 Call to grandfather Arthur Hansen, he is aware of previous SI and agrees to take to ER is patient begins having those thoughts again. Aware EEG scheduled for March

## 2023-09-07 NOTE — Telephone Encounter (Signed)
 Call to number provided,it was the grandfather. DPR states can only speak with patient, Asked grandfather to have patient return call.

## 2023-09-07 NOTE — Telephone Encounter (Signed)
 IF worsening suicidal ideation, family needs to take him to the ED. EEG schedule for March.

## 2023-09-07 NOTE — Telephone Encounter (Signed)
 Unable to reach pt over the phone, call could not be completed. Sent mychart msg informing pt of need to reschedule 09/09/23 appt - Tresa Endo out

## 2023-09-07 NOTE — Telephone Encounter (Signed)
 Received call back from patient, he states we can speak to his grandfather Gerlene Burdock. Patient states he is having daily headaches, chest tightness and heart palpitations. He feels like it can be anxiety related. He has a lot of stressors in him life. He voiced SI at times but no plan. He says previously he would feel like he would walk out in front of a car or find something to hurt himself with. He is not having those thoughts at this time. He just had a long good conversation with his grandmother. He is really wanting EEG completed but has been rescheduled 3 times due to sickness from staff. I advise I would send this to Dr. Teresa Coombs to review. Advised  if he began to have SI/HI to go to the ER. He voiced understanding.

## 2023-09-08 ENCOUNTER — Telehealth: Payer: Self-pay | Admitting: Neurology

## 2023-09-08 NOTE — Telephone Encounter (Signed)
 Pt's mother called irate yelling and not letting anyone speak. She called complaining that no one cares about her son and that he needs an appt now for his EEG. She stated that it is an emergency and that she does not care about policies and that we need to get her son in right now. I explained to her that I could check and see if there were any cx so that we can get him in sooner She stated that she didn't want me to check she wanted me to get him in. I tried to explain to her that if it was an emergency she should take him to the ER. She started yelling and not letting me talk and having foul language and I had to end the call.

## 2023-09-08 NOTE — Telephone Encounter (Signed)
 Pt's mother called asking that an office manager over phone staff be brought to the phone in order for her to express her dissatisfaction re: her previous call. Phone rep apologized and explained due to expected weather there would be no way for the phone staff manager to come and take her call.  Pt's mother expressed her dissatisfaction re: how 3 times pt's EEG appointment has been r/s.  It is her opinion that the office should have a back up so that not only her son, but other pt's not have to wait longer and longer.  Pt's mother accepted son being placed on wait list, Phone rep explained that due to it not being known how intense the weather will be today and tomorrow it is possible the office may not reopen before Friday.  Pt's mother said she will call on Friday in hopes of being able to speak with a office manager.

## 2023-09-08 NOTE — Telephone Encounter (Signed)
 Attempted to return call, no answer. Mailbox full and could not leave message. Mother not  on DPR. Patient gave me verbal permission (2/18)  to speak with grandfather richard only. He is scheduled for an EEG 3/6. Normal EEG in Jan 2025. If needs are urgent or patient expressing suicidal ideation or homicidal ideation, advised grandfather and patient  to go to ER. Both verbalized understanding 2/18.

## 2023-09-09 ENCOUNTER — Other Ambulatory Visit: Payer: 59 | Admitting: *Deleted

## 2023-09-23 ENCOUNTER — Ambulatory Visit (INDEPENDENT_AMBULATORY_CARE_PROVIDER_SITE_OTHER): Payer: 59 | Admitting: Neurology

## 2023-09-23 DIAGNOSIS — R569 Unspecified convulsions: Secondary | ICD-10-CM | POA: Diagnosis not present

## 2023-09-23 NOTE — Procedures (Signed)
    History:  33 year old man with seizure   EEG classification: Awake and drowsy  Duration: 25 minutes  Technical aspects: This EEG study was done with scalp electrodes positioned according to the 10-20 International system of electrode placement. Electrical activity was reviewed with band pass filter of 1-70Hz , sensitivity of 7 uV/mm, display speed of 80mm/sec with a 60Hz  notched filter applied as appropriate. EEG data were recorded continuously and digitally stored.   Description of the recording: The background rhythms of this recording consists of a fairly well modulated medium amplitude alpha rhythm of 9-10 Hz that is reactive to eye opening and closure. Present in the anterior head region is a 15-20 Hz beta activity. Photic stimulation was performed, did not show any abnormalities. Hyperventilation was also performed, did not show any abnormalities. Drowsiness was manifested by background fragmentation. No abnormal epileptiform discharges seen during this recording. There was no focal slowing. There were no electrographic seizure identified.   Abnormality: None   Impression: This is a normal awake and drowsy EEG. No evidence of interictal epileptiform discharges. Normal EEGs, however, do not rule out epilepsy.    Windell Norfolk, MD [facility]

## 2024-02-10 ENCOUNTER — Ambulatory Visit: Payer: 59 | Admitting: Neurology

## 2024-02-10 ENCOUNTER — Encounter: Payer: Self-pay | Admitting: Neurology
